# Patient Record
Sex: Female | Born: 1985 | Race: White | Hispanic: No | Smoking: Current every day smoker
Health system: Southern US, Community
[De-identification: ages and names within clinical notes are randomized; demographics above are authoritative.]

## PROBLEM LIST (undated history)

## (undated) DIAGNOSIS — J45909 Unspecified asthma, uncomplicated: Secondary | ICD-10-CM

## (undated) DIAGNOSIS — K219 Gastro-esophageal reflux disease without esophagitis: Secondary | ICD-10-CM

## (undated) DIAGNOSIS — F419 Anxiety disorder, unspecified: Secondary | ICD-10-CM

## (undated) DIAGNOSIS — T7840XA Allergy, unspecified, initial encounter: Secondary | ICD-10-CM

## (undated) DIAGNOSIS — F32A Depression, unspecified: Secondary | ICD-10-CM

## (undated) DIAGNOSIS — Z5189 Encounter for other specified aftercare: Secondary | ICD-10-CM

## (undated) HISTORY — PX: APPENDECTOMY: SHX54

## (undated) HISTORY — PX: CHOLECYSTECTOMY: SHX55

## (undated) HISTORY — DX: Unspecified asthma, uncomplicated: J45.909

## (undated) HISTORY — DX: Anxiety disorder, unspecified: F41.9

## (undated) HISTORY — PX: TONSILLECTOMY: SUR1361

## (undated) HISTORY — DX: Allergy, unspecified, initial encounter: T78.40XA

## (undated) HISTORY — PX: TUBAL LIGATION: SHX77

## (undated) HISTORY — DX: Gastro-esophageal reflux disease without esophagitis: K21.9

## (undated) HISTORY — DX: Encounter for other specified aftercare: Z51.89

## (undated) HISTORY — DX: Depression, unspecified: F32.A

---
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
## 2017-01-26 ENCOUNTER — Other Ambulatory Visit (HOSPITAL_COMMUNITY): Payer: Self-pay | Admitting: Family Medicine

## 2017-01-26 DIAGNOSIS — M79662 Pain in left lower leg: Secondary | ICD-10-CM

## 2017-01-31 ENCOUNTER — Ambulatory Visit (HOSPITAL_COMMUNITY)
Admission: RE | Admit: 2017-01-31 | Discharge: 2017-01-31 | Disposition: A | Discharge status: Home / Self Care | Payer: Medicaid Other | Source: Ambulatory Visit | Attending: Family Medicine | Admitting: Family Medicine

## 2017-01-31 DIAGNOSIS — M79662 Pain in left lower leg: Secondary | ICD-10-CM | POA: Insufficient documentation

## 2017-11-01 IMAGING — US US EXTREM LOW VENOUS*L*
1 series · 13 of 24 positions shown · non-contrast
Comparison: None.

CLINICAL DATA: Initial evaluation for acute left lower extremity
pain.



[Series 1: us extrem low venous*left* · 0.07mm/px · 13 of 38 slices shown]
[im 1/38]
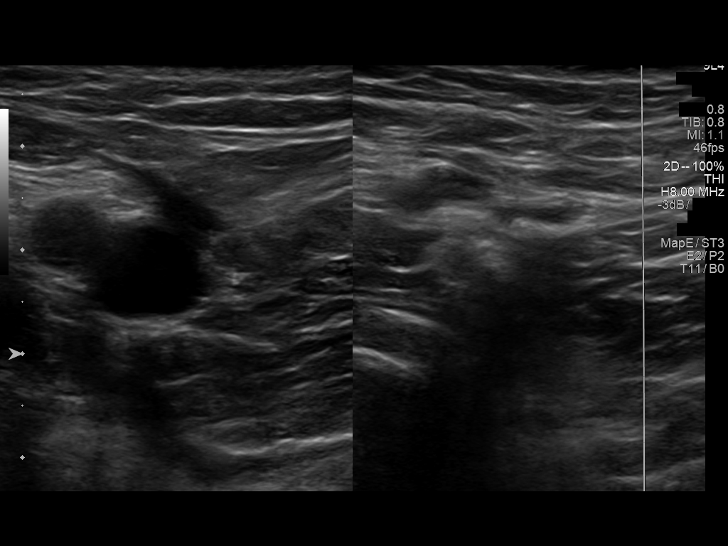
[im 4/38]
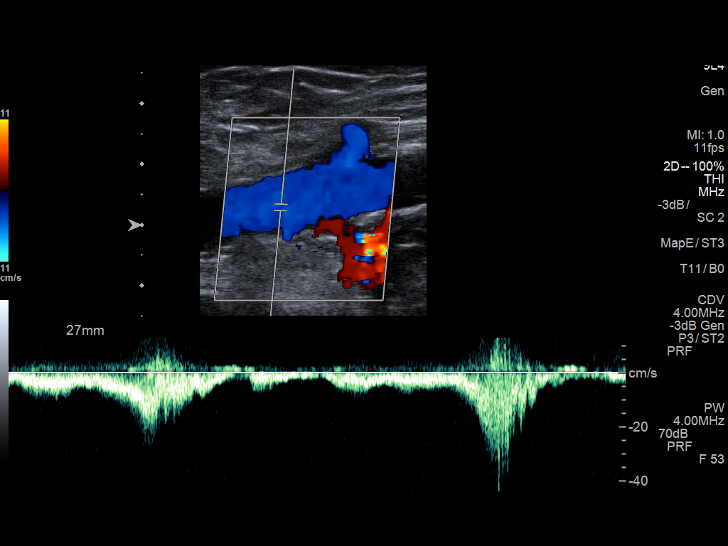
[im 7/38]
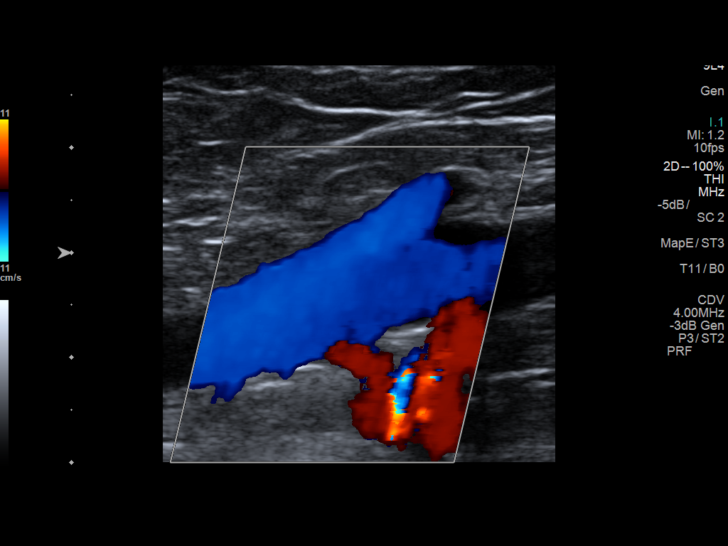
[im 10/38]
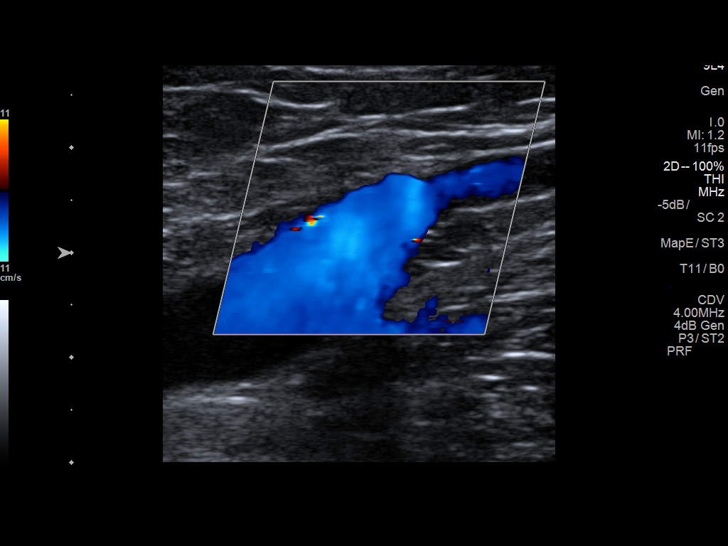
[im 13/38]
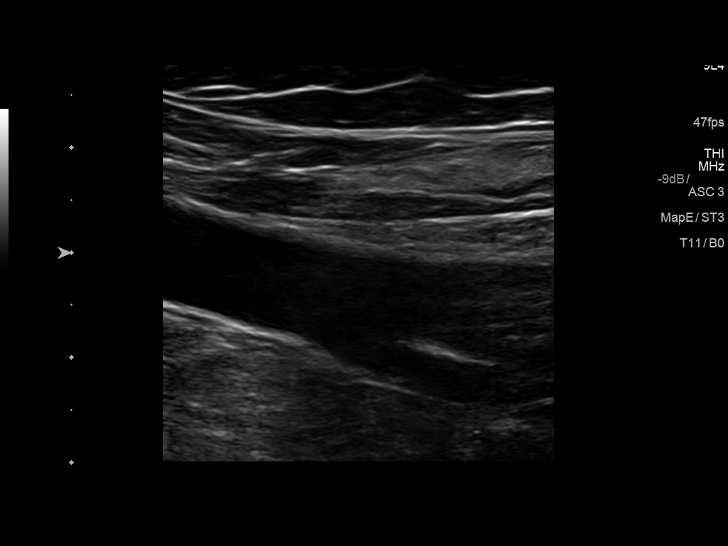
[im 17/38]
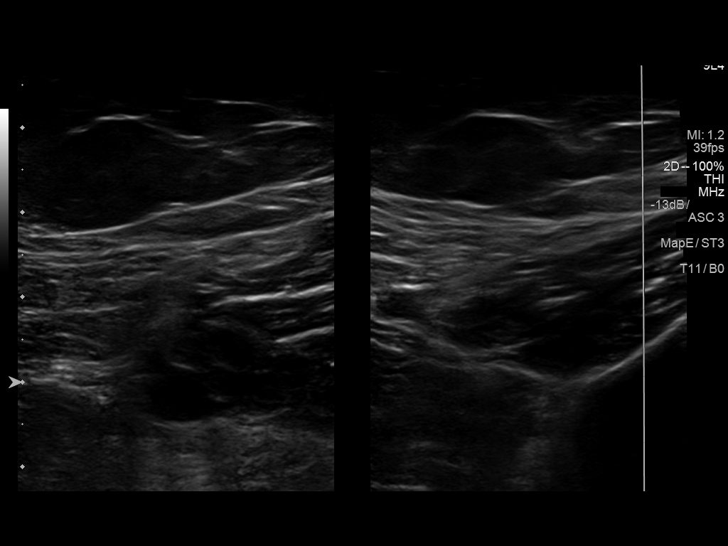
[im 20/38]
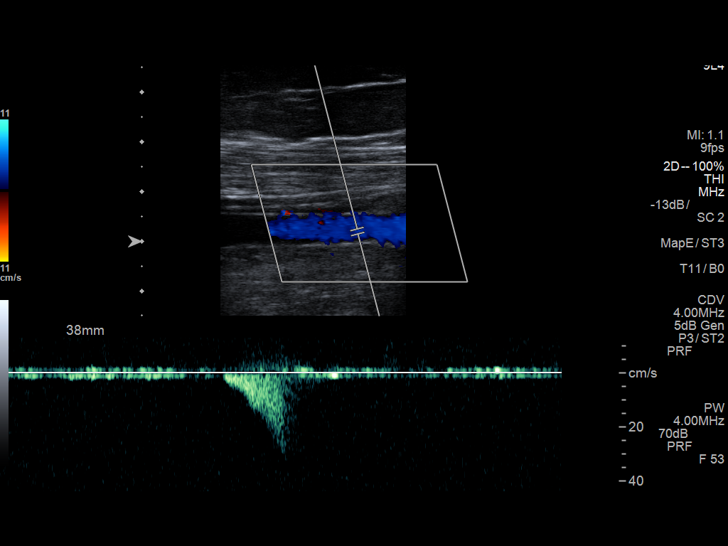
[im 21/38]
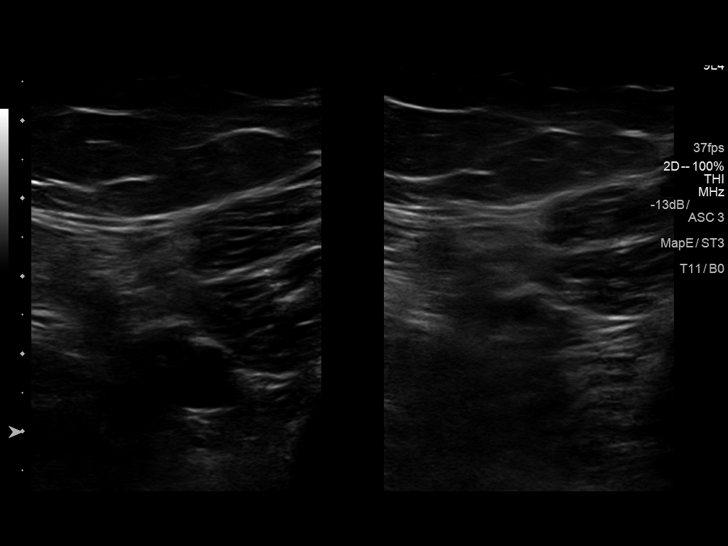
[im 25/38]
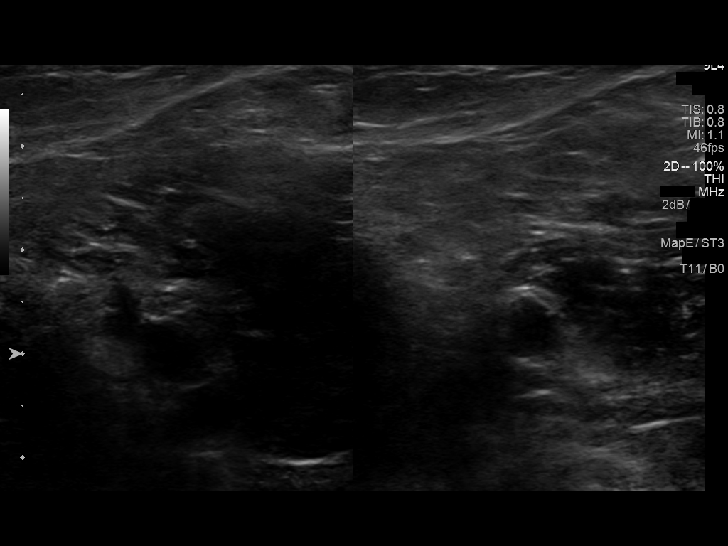
[im 28/38]
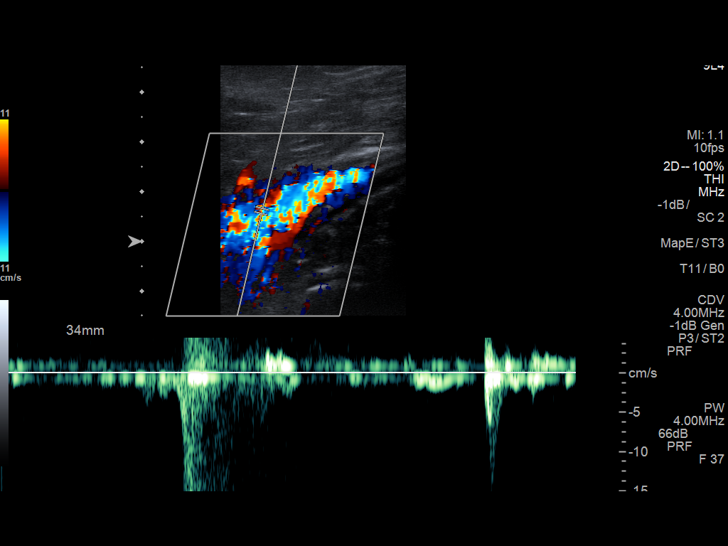
[im 31/38]
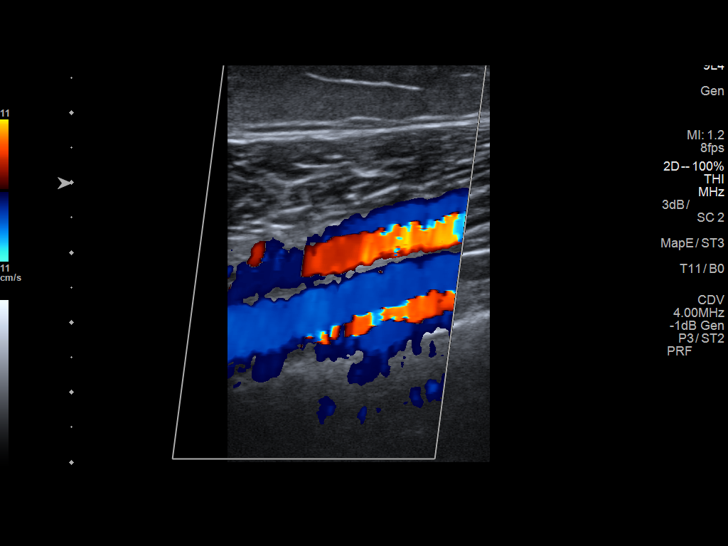
[im 34/38]
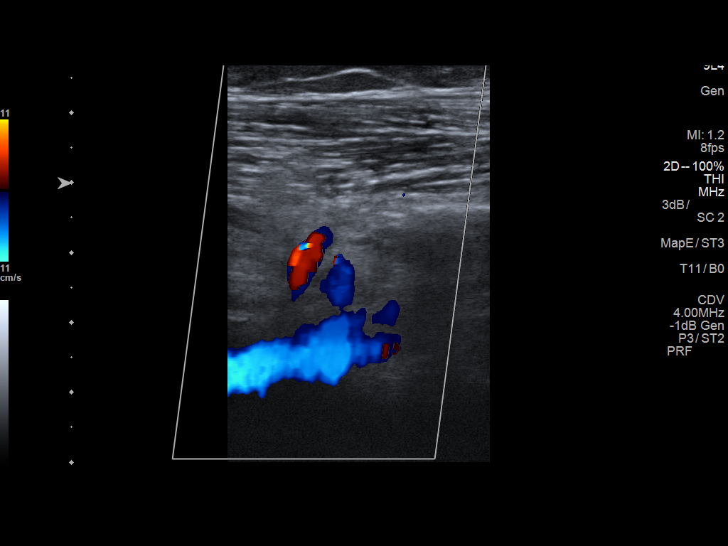
[im 38/38]
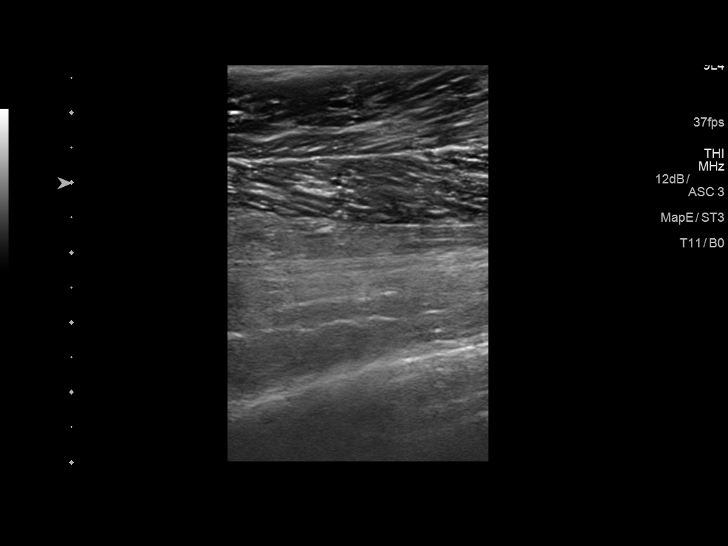

[13 of 24 positions shown; findings below may reference images not displayed]

FINDINGS: Contralateral Common Femoral Vein: Respiratory phasicity is normal
and symmetric with the symptomatic side. No evidence of thrombus.
Normal compressibility.

Common Femoral Vein: No evidence of thrombus. Normal
compressibility, respiratory phasicity and response to augmentation.

Saphenofemoral Junction: No evidence of thrombus. Normal
compressibility and flow on color Doppler imaging.

Profunda Femoral Vein: No evidence of thrombus. Normal
compressibility and flow on color Doppler imaging.

Femoral Vein: No evidence of thrombus. Normal compressibility,
respiratory phasicity and response to augmentation.

Popliteal Vein: No evidence of thrombus. Normal compressibility,
respiratory phasicity and response to augmentation.

Calf Veins: No evidence of thrombus. Normal compressibility and flow
on color Doppler imaging.

Superficial Great Saphenous Vein: No evidence of thrombus. Normal
compressibility and flow on color Doppler imaging.

Venous Reflux:  None.

Other Findings:  None.
IMPRESSION: No evidence of deep venous thrombosis.

## 2019-07-08 LAB — RESULTS CONSOLE HPV: CHL HPV: NEGATIVE

## 2019-07-08 LAB — HM PAP SMEAR: HM Pap smear: NEGATIVE

## 2019-07-23 DIAGNOSIS — F17200 Nicotine dependence, unspecified, uncomplicated: Secondary | ICD-10-CM | POA: Insufficient documentation

## 2019-07-23 DIAGNOSIS — R5383 Other fatigue: Secondary | ICD-10-CM | POA: Insufficient documentation

## 2019-07-23 DIAGNOSIS — F329 Major depressive disorder, single episode, unspecified: Secondary | ICD-10-CM | POA: Insufficient documentation

## 2019-07-23 DIAGNOSIS — D72828 Other elevated white blood cell count: Secondary | ICD-10-CM | POA: Insufficient documentation

## 2019-08-14 DIAGNOSIS — E669 Obesity, unspecified: Secondary | ICD-10-CM | POA: Insufficient documentation

## 2019-12-30 ENCOUNTER — Ambulatory Visit: Payer: Medicaid Other

## 2021-03-03 ENCOUNTER — Encounter: Payer: Self-pay | Admitting: Physician Assistant

## 2021-03-03 ENCOUNTER — Ambulatory Visit (INDEPENDENT_AMBULATORY_CARE_PROVIDER_SITE_OTHER): Payer: Medicaid Other | Admitting: Physician Assistant

## 2021-03-03 ENCOUNTER — Other Ambulatory Visit: Payer: Self-pay

## 2021-03-03 VITALS — BP 122/78 | HR 81 | Temp 97.8°F | Ht 62.0 in | Wt 183.2 lb

## 2021-03-03 DIAGNOSIS — B009 Herpesviral infection, unspecified: Secondary | ICD-10-CM | POA: Diagnosis not present

## 2021-03-03 DIAGNOSIS — L700 Acne vulgaris: Secondary | ICD-10-CM | POA: Diagnosis not present

## 2021-03-03 DIAGNOSIS — F17210 Nicotine dependence, cigarettes, uncomplicated: Secondary | ICD-10-CM | POA: Diagnosis not present

## 2021-03-03 MED ORDER — VALACYCLOVIR HCL 500 MG PO TABS: 500.0000 mg | ORAL_TABLET | Freq: Two times a day (BID) | ORAL | 0 refills | Status: AC

## 2021-03-03 MED ORDER — SPIRONOLACTONE 25 MG PO TABS: 25.0000 mg | ORAL_TABLET | Freq: Two times a day (BID) | ORAL | 2 refills | Status: DC

## 2021-03-03 NOTE — Progress Notes (Signed)
New Patient Office Visit  Subjective:  Patient ID: Nicole Pitts, female    DOB: 22-Dec-1985  Age: 35 y.o. MRN: [id number]  HPI MAGUIRE KILLMER presents for new patient establishment. She previously saw me at [facility] & I am familiar with her history. 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 She lives with her fiance. She is not taking any medications at this time.  Two concerns today: Cystic acne - She was seeing [facility] dermatology for Accutane, but only completed three months of treatment because it was too far of a drive and she was not seeing any differences.  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  Past Medical History:  Diagnosis Date  . Allergy   . Anxiety   . Asthma   . Blood transfusion without reported diagnosis   . Depression   . GERD (gastroesophageal reflux disease)     Past Surgical History:  Procedure Laterality Date  . APPENDECTOMY    . CHOLECYSTECTOMY    . TONSILLECTOMY    . TUBAL LIGATION      History reviewed. No pertinent family history.  Social History   Socioeconomic History  . Marital status: Single    Spouse name: Not on file  . Number of children: Not on file  . Years of education: Not on file  . Highest education level: Not on file  Occupational History  . Not on file  Tobacco Use  . Smoking status: Current Every Day Smoker    Types: Cigarettes  . Smokeless tobacco: Never Used  Vaping Use  . Vaping Use: Some days  Substance and Sexual Activity  . Alcohol use: Not Currently  . Drug use: Not on file  . Sexual activity: Not on file  Other Topics Concern  . Not on file  Social History Narrative  . Not on file   Social Determinants of Health   Financial Resource Strain: Not on file  Food Insecurity: Not on file  Transportation Needs: Not on file  Physical Activity: Not on file  Stress: Not on file  Social Connections: Not on file  Intimate Partner  Violence: Not on file    ROS Review of Systems REFER TO HPI FOR PERTINENT POSITIVES AND NEGATIVES  Objective:   Today's Vitals: BP 122/78   Pulse 81   Temp 97.8 F (36.6 C)   Ht 5\' 2"  (1.575 m)   Wt 183 lb 3.2 oz (83.1 kg)   SpO2 96%   BMI 33.51 kg/m   Physical Exam Vitals and nursing note reviewed.  Constitutional:      General: She is not in acute distress.    Appearance: She is obese.  Eyes:     Extraocular Movements: Extraocular movements intact.     Conjunctiva/sclera: Conjunctivae normal.     Pupils: Pupils are equal, round, and reactive to light.  Cardiovascular:     Pulses: Normal pulses.     Heart sounds: Normal heart sounds. No murmur heard.   Pulmonary:     Effort: Pulmonary effort is normal.     Breath sounds: Normal breath sounds.  Skin:    Comments: Cystic acne lesions and scarring present along chin, cheeks  Neurological:     Mental Status: She is alert.  Psychiatric:        Mood and Affect: Mood normal.     Assessment & Plan:   Problem List Items Addressed This Visit  None   Visit Diagnoses    Cystic acne vulgaris    -  Primary   HSV infection       Relevant Medications   valACYclovir (VALTREX) 500 MG tablet   Cigarette nicotine dependence without complication          Outpatient Encounter Medications as of 03/03/2021  Medication Sig  . spironolactone (ALDACTONE) 25 MG tablet Take 1 tablet (25 mg total) by mouth 2 (two) times daily.  . valACYclovir (VALTREX) 500 MG tablet Take 1 tablet (500 mg total) by mouth 2 (two) times daily for 5 days.   No facility-administered encounter medications on file as of 03/03/2021.    Follow-up: Return in about 4 months (around 07/03/2021) for Well Woman exam and fasting labs .   1. Cystic acne vulgaris She does not want to go back to dermatology at this time due to drive. I will try Spironolactone with her. Start at 25 mg once daily and then increase to BID after two weeks if tolerating well. Side  effects discussed. Call if any concerns.  2. HSV infection Known hx of HSV-2. Will treat with Valtrex as directed. She will recheck if worse or no improvement.  3. Cigarette nicotine dependence without complication Encouraged pt to consider quitting smoking. She is not ready at this time.   This visit occurred during the SARS-CoV-2 public health emergency.  Safety protocols were in place, including screening questions prior to the visit, additional usage of staff PPE, and extensive cleaning of exam room while observing appropriate contact time as indicated for disinfecting solutions.    Catalina Salasar M Crista Nuon, PA-C

## 2021-03-03 NOTE — Patient Instructions (Signed)
It was good to see you today!  Prescriptions have been sent to your pharmacy. Please take as directed and call if any problems. Continue to try to work on health changes including considering quitting smoking.  See you back for a well woman exam this Summer.

## 2021-07-05 ENCOUNTER — Other Ambulatory Visit (HOSPITAL_COMMUNITY)
Admission: RE | Admit: 2021-07-05 | Discharge: 2021-07-05 | Disposition: A | Discharge status: Home / Self Care | Payer: Medicaid Other | Source: Ambulatory Visit | Attending: Physician Assistant | Admitting: Physician Assistant

## 2021-07-05 ENCOUNTER — Encounter: Payer: Medicaid Other | Admitting: Physician Assistant

## 2021-07-05 ENCOUNTER — Other Ambulatory Visit: Payer: Self-pay

## 2021-07-05 ENCOUNTER — Encounter: Payer: Self-pay | Admitting: Physician Assistant

## 2021-07-05 ENCOUNTER — Ambulatory Visit (INDEPENDENT_AMBULATORY_CARE_PROVIDER_SITE_OTHER): Payer: Medicaid Other | Admitting: Physician Assistant

## 2021-07-05 VITALS — BP 110/74 | HR 64 | Temp 98.3°F | Ht 62.0 in | Wt 177.8 lb

## 2021-07-05 DIAGNOSIS — Z124 Encounter for screening for malignant neoplasm of cervix: Secondary | ICD-10-CM

## 2021-07-05 DIAGNOSIS — Z0000 Encounter for general adult medical examination without abnormal findings: Secondary | ICD-10-CM

## 2021-07-05 DIAGNOSIS — Z131 Encounter for screening for diabetes mellitus: Secondary | ICD-10-CM

## 2021-07-05 DIAGNOSIS — Z114 Encounter for screening for human immunodeficiency virus [HIV]: Secondary | ICD-10-CM

## 2021-07-05 DIAGNOSIS — L700 Acne vulgaris: Secondary | ICD-10-CM | POA: Diagnosis not present

## 2021-07-05 DIAGNOSIS — Z1159 Encounter for screening for other viral diseases: Secondary | ICD-10-CM | POA: Diagnosis not present

## 2021-07-05 DIAGNOSIS — Z13220 Encounter for screening for lipoid disorders: Secondary | ICD-10-CM | POA: Diagnosis not present

## 2021-07-05 LAB — TSH: TSH: 2.03 u[IU]/mL (ref 0.35–5.50)

## 2021-07-05 LAB — LIPID PANEL
Cholesterol: 213 mg/dL — ABNORMAL HIGH (ref 0–200)
HDL: 40.8 mg/dL (ref >39.00)
LDL Cholesterol: 133 mg/dL — ABNORMAL HIGH (ref 0–99)
NonHDL: 172.53
Total CHOL/HDL Ratio: 5
Triglycerides: 199 mg/dL — ABNORMAL HIGH (ref 0.0–149.0)
VLDL: 39.8 mg/dL (ref 0.0–40.0)

## 2021-07-05 LAB — CBC WITH DIFFERENTIAL/PLATELET
Basophils Absolute: 0.1 10*3/uL (ref 0.0–0.1)
Basophils Relative: 0.9 % (ref 0.0–3.0)
Eosinophils Absolute: 0.3 10*3/uL (ref 0.0–0.7)
Eosinophils Relative: 2.6 % (ref 0.0–5.0)
HCT: 43.2 % (ref 36.0–46.0)
Hemoglobin: 14.3 g/dL (ref 12.0–15.0)
Lymphocytes Relative: 24.6 % (ref 12.0–46.0)
Lymphs Abs: 2.9 10*3/uL (ref 0.7–4.0)
MCHC: 33.2 g/dL (ref 30.0–36.0)
MCV: 85.8 fl (ref 78.0–100.0)
Monocytes Absolute: 0.6 10*3/uL (ref 0.1–1.0)
Monocytes Relative: 5.2 % (ref 3.0–12.0)
Neutro Abs: 7.9 10*3/uL — ABNORMAL HIGH (ref 1.4–7.7)
Neutrophils Relative %: 66.7 % (ref 43.0–77.0)
Platelets: 361 10*3/uL (ref 150.0–400.0)
RBC: 5.03 Mil/uL (ref 3.87–5.11)
RDW: 13.2 % (ref 11.5–15.5)
WBC: 11.8 10*3/uL — ABNORMAL HIGH (ref 4.0–10.5)

## 2021-07-05 LAB — COMPREHENSIVE METABOLIC PANEL
ALT: 22 U/L (ref 0–35)
AST: 17 U/L (ref 0–37)
Albumin: 4.5 g/dL (ref 3.5–5.2)
Alkaline Phosphatase: 81 U/L (ref 39–117)
BUN: 5 mg/dL — ABNORMAL LOW (ref 6–23)
CO2: 27 mEq/L (ref 19–32)
Calcium: 10.2 mg/dL (ref 8.4–10.5)
Chloride: 101 mEq/L (ref 96–112)
Creatinine, Ser: 0.78 mg/dL (ref 0.40–1.20)
GFR: 99.43 mL/min (ref >60.00)
Glucose, Bld: 88 mg/dL (ref 70–99)
Potassium: 4.2 mEq/L (ref 3.5–5.1)
Sodium: 136 mEq/L (ref 135–145)
Total Bilirubin: 0.3 mg/dL (ref 0.2–1.2)
Total Protein: 7.8 g/dL (ref 6.0–8.3)

## 2021-07-05 MED ORDER — SPIRONOLACTONE 25 MG PO TABS: 25.0000 mg | ORAL_TABLET | Freq: Two times a day (BID) | ORAL | 2 refills | Status: DC

## 2021-07-05 NOTE — Progress Notes (Signed)
Established Patient Office Visit  Subjective:  Patient ID: Nicole Pitts, female    DOB: 07-17-1986  Age: 35 y.o. MRN: [id number]  CC:  Chief Complaint  Patient presents with   Annual Exam    Pt has no concerns today.     HPI Nicole Pitts presents for annual CPE.  Acute concerns: Cystic acne - much improved with spironolactone  Health maintenance: Lifestyle/ exercise: Up & down with motivation Nutrition: Only eats once per day, usually dinner time; mix between eating out or cooking at home  Mental health: Depression worse during the winter; doing good right now though and not on any treatment  Caffeine: Monster drinks, usually once per day, and coffee  Sleep: Sleep is so-so right now with kids out for the summer  Substance use: Rare alcohol; still smoking & occasional vaping  Sexual activity: Monogamous  Immunizations: UTD  Pap: Due for pap today Menstrual cycles: Regular, no issues  Past Medical History:  Diagnosis Date   Allergy    Anxiety    Asthma    Blood transfusion without reported diagnosis    Depression    GERD (gastroesophageal reflux disease)     Past Surgical History:  Procedure Laterality Date   APPENDECTOMY     CHOLECYSTECTOMY     TONSILLECTOMY     TUBAL LIGATION      History reviewed. No pertinent family history.  Social History   Socioeconomic History   Marital status: Single    Spouse name: Not on file   Number of children: Not on file   Years of education: Not on file   Highest education level: Not on file  Occupational History   Not on file  Tobacco Use   Smoking status: Every Day    Types: Cigarettes   Smokeless tobacco: Never  Vaping Use   Vaping Use: Some days  Substance and Sexual Activity   Alcohol use: Not Currently   Drug use: Not on file   Sexual activity: Not on file  Other Topics Concern   Not on file  Social History Narrative   Not on file   Social Determinants of Health   Financial Resource  Strain: Not on file  Food Insecurity: Not on file  Transportation Needs: Not on file  Physical Activity: Not on file  Stress: Not on file  Social Connections: Not on file  Intimate Partner Violence: Not on file    Outpatient Medications Prior to Visit  Medication Sig Dispense Refill   spironolactone (ALDACTONE) 25 MG tablet Take 1 tablet (25 mg total) by mouth 2 (two) times daily. 60 tablet 2   No facility-administered medications prior to visit.    Allergies  Allergen Reactions   Lithium Other (See Comments)    Worsened bipolar Worsened bipolar    Quetiapine Other (See Comments)    Worsened bipolar Worsened bipolar    Clindamycin Rash   Penicillins Rash    ROS Review of Systems  Constitutional:  Negative for activity change, appetite change, fever and unexpected weight change.  HENT:  Negative for congestion.   Eyes:  Negative for visual disturbance.  Respiratory:  Negative for apnea, cough and shortness of breath.   Cardiovascular:  Negative for chest pain, palpitations and leg swelling.  Gastrointestinal:  Negative for abdominal pain, blood in stool, constipation and diarrhea.  Endocrine: Negative for polydipsia, polyphagia and polyuria.  Genitourinary:  Negative for dysuria and pelvic pain.  Musculoskeletal:  Negative for arthralgias.  Skin:  Negative  for rash.  Neurological:  Negative for dizziness, weakness and headaches.  Hematological:  Negative for adenopathy. Does not bruise/bleed easily.  Psychiatric/Behavioral:  Positive for dysphoric mood (comes and goes). Negative for sleep disturbance and suicidal ideas. The patient is not nervous/anxious.      Objective:    Physical Exam Vitals and nursing note reviewed. Exam conducted with a chaperone present.  Constitutional:      General: She is not in acute distress.    Appearance: Normal appearance. She is normal weight.  HENT:     Head: Normocephalic.     Right Ear: External ear normal.     Left Ear:  External ear normal.     Nose: Nose normal.     Mouth/Throat:     Mouth: Mucous membranes are moist.  Eyes:     Extraocular Movements: Extraocular movements intact.     Conjunctiva/sclera: Conjunctivae normal.     Pupils: Pupils are equal, round, and reactive to light.  Cardiovascular:     Rate and Rhythm: Normal rate and regular rhythm.     Pulses: Normal pulses.     Heart sounds: No murmur heard. Pulmonary:     Effort: Pulmonary effort is normal.     Breath sounds: Normal breath sounds.  Chest:  Breasts:    Right: Normal.     Left: Normal.  Abdominal:     General: Abdomen is flat. Bowel sounds are normal.     Palpations: Abdomen is soft.     Tenderness: There is no abdominal tenderness.  Genitourinary:    Labia:        Right: No rash, tenderness or lesion.        Left: No rash, tenderness or lesion.      Vagina: Normal.     Cervix: Normal and dilated.     Uterus: Normal.      Adnexa: Right adnexa normal and left adnexa normal.  Musculoskeletal:        General: Normal range of motion.     Cervical back: Normal range of motion.  Skin:    General: Skin is warm.  Neurological:     General: No focal deficit present.     Mental Status: She is alert and oriented to person, place, and time.     Gait: Gait normal.  Psychiatric:        Mood and Affect: Mood normal.        Behavior: Behavior normal.    BP 110/74   Pulse 64   Temp 98.3 F (36.8 C) (Temporal)   Ht 5' 2" (1.575 m)   Wt 177 lb 12.8 oz (80.6 kg)   LMP 06/20/2021 (Approximate)   SpO2 98%   BMI 32.52 kg/m  Wt Readings from Last 3 Encounters:  07/05/21 177 lb 12.8 oz (80.6 kg)  03/03/21 183 lb 3.2 oz (83.1 kg)     Health Maintenance Due  Topic Date Due   Pneumococcal Vaccine 12-54 Years old (1 - PCV) Never done   PAP SMEAR-Modifier  Never done   INFLUENZA VACCINE  06/27/2021    There are no preventive care reminders to display for this patient.  No results found for: TSH No results found for: WBC,  HGB, HCT, MCV, PLT No results found for: NA, K, CHLORIDE, CO2, GLUCOSE, BUN, CREATININE, BILITOT, ALKPHOS, AST, ALT, PROT, ALBUMIN, CALCIUM, ANIONGAP, EGFR, GFR No results found for: CHOL No results found for: HDL No results found for: LDLCALC No results found for: TRIG No results  found for: CHOLHDL No results found for: HGBA1C    Assessment & Plan:   Problem List Items Addressed This Visit   None Visit Diagnoses     Encounter for annual physical exam    -  Primary   Relevant Orders   TSH   Comprehensive metabolic panel   CBC with Differential/Platelet   Lipid panel   Cystic acne vulgaris       Diabetes mellitus screening       Relevant Orders   Comprehensive metabolic panel   Need for hepatitis C screening test       Relevant Orders   Hepatitis C antibody   Encounter for screening for HIV       Relevant Orders   HIV Antibody (routine testing w rflx)   Encounter for Papanicolaou smear for cervical cancer screening       Relevant Orders   Cytology - PAP   Screening for cholesterol level       Relevant Orders   Lipid panel       Meds ordered this encounter  Medications   spironolactone (ALDACTONE) 25 MG tablet    Sig: Take 1 tablet (25 mg total) by mouth 2 (two) times daily.    Dispense:  60 tablet    Refill:  2    Follow-up: Return in about 1 year (around 07/05/2022) for Well woman .   Plan: Age-appropriate screening and counseling performed today. Will check labs and call with results. Preventive measures discussed and printed in AVS for patient. Encouraged continue to work on healthy diet and exercise changes. Needs to limit caffeine.    Alyssa M Allwardt, PA-C

## 2021-07-05 NOTE — Patient Instructions (Signed)
Good to see you today! Please go to the lab for blood work and I will send results through MyChart.  I'm glad the spironolactone is working!  Call if any concerns.

## 2021-07-06 LAB — CYTOLOGY - PAP
Adequacy: ABSENT
Chlamydia: NEGATIVE
Comment: NEGATIVE
Comment: NEGATIVE
Comment: NORMAL
Diagnosis: NEGATIVE
Neisseria Gonorrhea: NEGATIVE
Trichomonas: NEGATIVE

## 2021-07-06 LAB — HEPATITIS C ANTIBODY
Hepatitis C Ab: NONREACTIVE
SIGNAL TO CUT-OFF: 0.13 (ref <1.00)

## 2021-07-06 LAB — HIV ANTIBODY (ROUTINE TESTING W REFLEX): HIV 1&2 Ab, 4th Generation: NONREACTIVE

## 2021-07-15 ENCOUNTER — Telehealth: Payer: Self-pay

## 2021-07-15 ENCOUNTER — Telehealth (INDEPENDENT_AMBULATORY_CARE_PROVIDER_SITE_OTHER): Payer: Medicaid Other | Admitting: Physician Assistant

## 2021-07-15 ENCOUNTER — Other Ambulatory Visit: Payer: Self-pay

## 2021-07-15 ENCOUNTER — Encounter: Payer: Self-pay | Admitting: Physician Assistant

## 2021-07-15 VITALS — Temp 98.3°F | Ht 62.0 in | Wt 177.7 lb

## 2021-07-15 DIAGNOSIS — J069 Acute upper respiratory infection, unspecified: Secondary | ICD-10-CM | POA: Diagnosis not present

## 2021-07-15 NOTE — Progress Notes (Signed)
Virtual Visit via Video Note  I connected with  Nicole Pitts  on 07/15/21 at  4:00 PM EDT by a video enabled telemedicine application and verified that I am speaking with the correct person using two identifiers.  Location: Patient: home Provider: Nature conservation officer at [employer] Persons present: Patient and myself   I discussed the limitations of evaluation and management by telemedicine and the availability of in person appointments. The patient expressed understanding and agreed to proceed.   History of Present Illness: Chief complaint: Dry cough and congestion Symptom onset: 07/11/21 Pertinent positives: ST, Cough, congestion, loss of taste Pertinent negatives: Fever, chills, body aches, SOB, CP Treatments tried: Nyquil, Dayquil, Robitussin Vaccine status: Did not have COVID vaccines  Sick exposure: Fiance has had similar symptoms    Observations/Objective:  Gen: Awake, alert, no acute distress Resp: Breathing is even and non-labored Psych: calm/pleasant demeanor Neuro: Alert and Oriented x 3, + facial symmetry, speech is clear.   Assessment and Plan: 1. Viral upper respiratory illness Patient did call back after our initial phone call visit and she let me know that her rapid COVID-19 test was negative.  This was reassuring, as well as the fact that her fianc has had several negative tests as well.  I do think that this is probably another viral upper respiratory infection that should resolve with time on its own.  I did recommend supportive care for her including over-the-counter cough and cold medication such as Robitussin or Mucinex.  She should also push fluids and rest.  She may take Tylenol or ibuprofen as needed.  She will call back next week if she is worse or has any change in symptoms.  Follow Up Instructions:    I discussed the assessment and treatment plan with the patient. The patient was provided an opportunity to ask questions and all were  answered. The patient agreed with the plan and demonstrated an understanding of the instructions.   The patient was advised to call back or seek an in-person evaluation if the symptoms worsen or if the condition fails to improve as anticipated.  Video connection was lost at >50% of the duration of the visit, at which time the remainder of the visit was completed via audio only.  Total time spent with patient on the phone was 6 minutes 15 seconds.  Anfernee Peschke M Evany Schecter, PA-C

## 2021-07-15 NOTE — Telephone Encounter (Signed)
Notified patient of results,patient stated do you think this is bronchitis.She stated usually a antibiotic and steroid is prescribed do to her symptoms. Please advise

## 2021-07-15 NOTE — Telephone Encounter (Signed)
Patient is calling in stating her covid test is negative. Wanting to know next treatment options.

## 2021-08-12 DIAGNOSIS — M778 Other enthesopathies, not elsewhere classified: Secondary | ICD-10-CM | POA: Diagnosis not present

## 2022-02-15 DIAGNOSIS — H5213 Myopia, bilateral: Secondary | ICD-10-CM | POA: Diagnosis not present

## 2022-07-11 ENCOUNTER — Other Ambulatory Visit (HOSPITAL_COMMUNITY)
Admission: RE | Admit: 2022-07-11 | Discharge: 2022-07-11 | Disposition: A | Discharge status: Home / Self Care | Payer: Medicaid Other | Source: Ambulatory Visit | Attending: Physician Assistant | Admitting: Physician Assistant

## 2022-07-11 ENCOUNTER — Encounter: Payer: Self-pay | Admitting: Physician Assistant

## 2022-07-11 ENCOUNTER — Ambulatory Visit (INDEPENDENT_AMBULATORY_CARE_PROVIDER_SITE_OTHER): Payer: Medicaid Other | Admitting: Physician Assistant

## 2022-07-11 VITALS — BP 118/81 | HR 70 | Temp 98.0°F | Ht 62.0 in | Wt 182.0 lb

## 2022-07-11 DIAGNOSIS — F17210 Nicotine dependence, cigarettes, uncomplicated: Secondary | ICD-10-CM | POA: Diagnosis not present

## 2022-07-11 DIAGNOSIS — D72829 Elevated white blood cell count, unspecified: Secondary | ICD-10-CM

## 2022-07-11 DIAGNOSIS — E782 Mixed hyperlipidemia: Secondary | ICD-10-CM | POA: Diagnosis not present

## 2022-07-11 DIAGNOSIS — Z01419 Encounter for gynecological examination (general) (routine) without abnormal findings: Secondary | ICD-10-CM | POA: Insufficient documentation

## 2022-07-11 DIAGNOSIS — Z6833 Body mass index (BMI) 33.0-33.9, adult: Secondary | ICD-10-CM | POA: Diagnosis not present

## 2022-07-11 DIAGNOSIS — E6609 Other obesity due to excess calories: Secondary | ICD-10-CM

## 2022-07-11 LAB — CBC WITH DIFFERENTIAL/PLATELET
Basophils Absolute: 0.1 10*3/uL (ref 0.0–0.1)
Basophils Relative: 1 % (ref 0.0–3.0)
Eosinophils Absolute: 0.3 10*3/uL (ref 0.0–0.7)
Eosinophils Relative: 3.2 % (ref 0.0–5.0)
HCT: 41.4 % (ref 36.0–46.0)
Hemoglobin: 13.5 g/dL (ref 12.0–15.0)
Lymphocytes Relative: 26.7 % (ref 12.0–46.0)
Lymphs Abs: 2.7 10*3/uL (ref 0.7–4.0)
MCHC: 32.6 g/dL (ref 30.0–36.0)
MCV: 85.2 fl (ref 78.0–100.0)
Monocytes Absolute: 0.6 10*3/uL (ref 0.1–1.0)
Monocytes Relative: 5.5 % (ref 3.0–12.0)
Neutro Abs: 6.4 10*3/uL (ref 1.4–7.7)
Neutrophils Relative %: 63.6 % (ref 43.0–77.0)
Platelets: 316 10*3/uL (ref 150.0–400.0)
RBC: 4.86 Mil/uL (ref 3.87–5.11)
RDW: 13.8 % (ref 11.5–15.5)
WBC: 10 10*3/uL (ref 4.0–10.5)

## 2022-07-11 LAB — COMPREHENSIVE METABOLIC PANEL
ALT: 17 U/L (ref 0–35)
AST: 15 U/L (ref 0–37)
Albumin: 4.4 g/dL (ref 3.5–5.2)
Alkaline Phosphatase: 68 U/L (ref 39–117)
BUN: 8 mg/dL (ref 6–23)
CO2: 26 mEq/L (ref 19–32)
Calcium: 9.9 mg/dL (ref 8.4–10.5)
Chloride: 103 mEq/L (ref 96–112)
Creatinine, Ser: 0.74 mg/dL (ref 0.40–1.20)
GFR: 105.16 mL/min (ref >60.00)
Glucose, Bld: 87 mg/dL (ref 70–99)
Potassium: 4.7 mEq/L (ref 3.5–5.1)
Sodium: 138 mEq/L (ref 135–145)
Total Bilirubin: 0.3 mg/dL (ref 0.2–1.2)
Total Protein: 7 g/dL (ref 6.0–8.3)

## 2022-07-11 LAB — LIPID PANEL
Cholesterol: 216 mg/dL — ABNORMAL HIGH (ref 0–200)
HDL: 36.6 mg/dL — ABNORMAL LOW (ref >39.00)
NonHDL: 179.05
Total CHOL/HDL Ratio: 6
Triglycerides: 226 mg/dL — ABNORMAL HIGH (ref 0.0–149.0)
VLDL: 45.2 mg/dL — ABNORMAL HIGH (ref 0.0–40.0)

## 2022-07-11 LAB — TSH: TSH: 1.84 u[IU]/mL (ref 0.35–5.50)

## 2022-07-11 LAB — LDL CHOLESTEROL, DIRECT: Direct LDL: 148 mg/dL

## 2022-07-11 NOTE — Patient Instructions (Signed)
Keep working on lifestyle changes and quitting smoking! Labs and pap smear today

## 2022-07-11 NOTE — Progress Notes (Signed)
Subjective:    Patient ID: Nicole Pitts, female    DOB: 22-Nov-1986, 36 y.o.   MRN: [id number]  Chief Complaint  Patient presents with   Annual Exam    Pt coming into office for annual CPE and well women's exam, pt is fasting for labs if needed today with CPE; pt has no concerns;     HPI Patient is in today for annual well woman exam.  Acute concerns: No acute health concerns  Health maintenance: Lifestyle/ exercise: Working on walking more, trying to stay active with kids Nutrition: Working on balance Mental health: Doing well Caffeine: Coffee Sleep: Doing well Substance use: Cigarettes - 1 pack lasts 3 days now Sexual activity: Monogamous with fiance Immunizations: UTD  Pap: updating today  Mammogram: Start at age 36    Past Medical History:  Diagnosis Date   Allergy    Anxiety    Asthma    Blood transfusion without reported diagnosis    Depression    GERD (gastroesophageal reflux disease)     Past Surgical History:  Procedure Laterality Date   APPENDECTOMY     CHOLECYSTECTOMY     TONSILLECTOMY     TUBAL LIGATION      Family History  Problem Relation Age of Onset   Diabetes Mother    High Cholesterol Mother    Hypertension Mother    Diabetes Father    Hypertension Father    High Cholesterol Father    Heart Problems Father    Hypertension Brother    High Cholesterol Brother     Social History   Tobacco Use   Smoking status: Every Day    Types: Cigarettes   Smokeless tobacco: Never  Vaping Use   Vaping Use: Every day  Substance Use Topics   Alcohol use: Not Currently   Drug use: Not Currently     Allergies  Allergen Reactions   Lithium Other (See Comments)    Worsened bipolar Worsened bipolar    Quetiapine Other (See Comments)    Worsened bipolar Worsened bipolar    Clindamycin Rash   Penicillins Rash    Review of Systems NEGATIVE UNLESS OTHERWISE INDICATED IN HPI      Objective:     BP 118/81 (BP Location: Left  Arm)   Pulse 70   Temp 98 F (36.7 C) (Temporal)   Ht 5\' 2"  (1.575 m)   Wt 182 lb (82.6 kg)   LMP 07/03/2022 (Exact Date)   SpO2 98%   BMI 33.29 kg/m   Wt Readings from Last 3 Encounters:  07/11/22 182 lb (82.6 kg)  07/15/21 177 lb 11.1 oz (80.6 kg)  07/05/21 177 lb 12.8 oz (80.6 kg)    BP Readings from Last 3 Encounters:  07/11/22 118/81  07/05/21 110/74  03/03/21 122/78     Physical Exam Vitals and nursing note reviewed. Exam conducted with a chaperone present.  Constitutional:      Appearance: Normal appearance. She is normal weight. She is not toxic-appearing.  HENT:     Head: Normocephalic and atraumatic.     Right Ear: Tympanic membrane, ear canal and external ear normal.     Left Ear: Tympanic membrane, ear canal and external ear normal.     Nose: Nose normal.     Mouth/Throat:     Mouth: Mucous membranes are moist.  Eyes:     Extraocular Movements: Extraocular movements intact.     Conjunctiva/sclera: Conjunctivae normal.     Pupils: Pupils are equal,  round, and reactive to light.  Cardiovascular:     Rate and Rhythm: Normal rate and regular rhythm.     Pulses: Normal pulses.     Heart sounds: Normal heart sounds.  Pulmonary:     Effort: Pulmonary effort is normal.     Breath sounds: Normal breath sounds.  Chest:     Chest wall: No mass.  Breasts:    Right: Normal.     Left: Normal.  Abdominal:     General: Abdomen is flat. Bowel sounds are normal.     Palpations: Abdomen is soft.  Genitourinary:    Labia:        Right: No rash or tenderness.        Left: No rash or tenderness.      Vagina: Normal.     Cervix: Normal.     Uterus: Normal.      Adnexa: Right adnexa normal and left adnexa normal.  Musculoskeletal:        General: Normal range of motion.     Cervical back: Normal range of motion and neck supple.  Lymphadenopathy:     Upper Body:     Right upper body: No supraclavicular or axillary adenopathy.     Left upper body: No  supraclavicular or axillary adenopathy.  Skin:    General: Skin is warm and dry.     Findings: No lesion or rash.  Neurological:     General: No focal deficit present.     Mental Status: She is alert and oriented to person, place, and time.  Psychiatric:        Mood and Affect: Mood normal.        Behavior: Behavior normal.        Thought Content: Thought content normal.        Judgment: Judgment normal.        Assessment & Plan:   Problem List Items Addressed This Visit   None Visit Diagnoses     Well woman exam with routine gynecological exam    -  Primary   Relevant Orders   Cytology - PAP   CBC with Differential/Platelet   Comprehensive metabolic panel   Lipid panel   TSH   Leukocytosis, unspecified type       Relevant Orders   CBC with Differential/Platelet   Cigarette nicotine dependence without complication       Relevant Orders   CBC with Differential/Platelet   Mixed hyperlipidemia       Relevant Orders   Lipid panel   Class 1 obesity due to excess calories without serious comorbidity with body mass index (BMI) of 33.0 to 33.9 in adult       Relevant Orders   CBC with Differential/Platelet   Comprehensive metabolic panel   Lipid panel   TSH       Plan: Age-appropriate screening and counseling performed today. Will check labs and call with results. Preventive measures discussed and printed in AVS for patient.  -Pap smear performed -Encouraged to keep working on lifestyle and quitting smoking  Patient Counseling: [x]   Nutrition: Stressed importance of moderation in sodium/caffeine intake, saturated fat and cholesterol, caloric balance, sufficient intake of fresh fruits, vegetables, and fiber.  [x]   Stressed the importance of regular exercise.   [x]   Substance Abuse: Discussed cessation/primary prevention of tobacco, alcohol, or other drug use; driving or other dangerous activities under the influence; availability of treatment for abuse.   []   Injury  prevention: Discussed safety belts,  safety helmets, smoke detector, smoking near bedding or upholstery.   [x]   Sexuality: Discussed sexually transmitted diseases, partner selection, use of condoms, avoidance of unintended pregnancy  and contraceptive alternatives.   [x]   Dental health: Discussed importance of regular tooth brushing, flossing, and dental visits.  [x]   Health maintenance and immunizations reviewed. Please refer to Health maintenance section.      Return in about 1 year (around 07/12/2023) for CPE, labs .     Maleni Seyer M Codylee Patil, PA-C

## 2022-07-16 LAB — CYTOLOGY - PAP
Comment: NEGATIVE
Diagnosis: UNDETERMINED — AB
High risk HPV: NEGATIVE

## 2022-07-20 ENCOUNTER — Telehealth: Payer: Self-pay | Admitting: Physician Assistant

## 2022-07-20 NOTE — Telephone Encounter (Signed)
Patient requests to be called at ph# [phone] to be given pap results

## 2022-07-28 ENCOUNTER — Encounter: Payer: Self-pay | Admitting: Physician Assistant

## 2022-08-21 ENCOUNTER — Encounter: Payer: Self-pay | Admitting: *Deleted

## 2022-11-09 ENCOUNTER — Encounter: Payer: Self-pay | Admitting: *Deleted

## 2023-04-04 DIAGNOSIS — J069 Acute upper respiratory infection, unspecified: Secondary | ICD-10-CM | POA: Diagnosis not present

## 2023-06-27 ENCOUNTER — Encounter (INDEPENDENT_AMBULATORY_CARE_PROVIDER_SITE_OTHER): Payer: Self-pay

## 2023-07-13 ENCOUNTER — Encounter: Payer: Medicaid Other | Admitting: Physician Assistant

## 2023-08-01 DIAGNOSIS — M79671 Pain in right foot: Secondary | ICD-10-CM | POA: Diagnosis not present

## 2023-08-01 DIAGNOSIS — Z6841 Body Mass Index (BMI) 40.0 and over, adult: Secondary | ICD-10-CM | POA: Diagnosis not present

## 2023-08-01 DIAGNOSIS — R030 Elevated blood-pressure reading, without diagnosis of hypertension: Secondary | ICD-10-CM | POA: Diagnosis not present

## 2023-08-17 ENCOUNTER — Telehealth: Payer: Self-pay

## 2023-08-17 NOTE — Telephone Encounter (Signed)
Medicaid Managed Care   Unsuccessful Outreach Note  08/17/2023 Name: Nicole Pitts MRN: [id number] DOB: 29-Dec-1985  Referred by: Allwardt, Crist Infante, PA-C Reason for referral : No chief complaint on file.   An unsuccessful telephone outreach was attempted today. The patient was referred to the case management team for assistance with care management and care coordination.   Follow Up Plan: If patient returns call to provider office, please advise to call Embedded Care Management Care Guide Nicholes Rough* at [phone]*  Nicholes Rough, CMA Care Guide [employer]

## 2023-09-17 DIAGNOSIS — S61302A Unspecified open wound of right middle finger with damage to nail, initial encounter: Secondary | ICD-10-CM | POA: Diagnosis not present

## 2023-09-17 DIAGNOSIS — Z23 Encounter for immunization: Secondary | ICD-10-CM | POA: Diagnosis not present

## 2023-09-17 DIAGNOSIS — S61208A Unspecified open wound of other finger without damage to nail, initial encounter: Secondary | ICD-10-CM | POA: Diagnosis not present

## 2023-10-03 DIAGNOSIS — B349 Viral infection, unspecified: Secondary | ICD-10-CM | POA: Diagnosis not present

## 2023-10-03 DIAGNOSIS — R509 Fever, unspecified: Secondary | ICD-10-CM | POA: Diagnosis not present

## 2023-10-03 DIAGNOSIS — Z20822 Contact with and (suspected) exposure to covid-19: Secondary | ICD-10-CM | POA: Diagnosis not present

## 2023-10-18 ENCOUNTER — Encounter: Payer: Medicaid Other | Admitting: Physician Assistant

## 2023-12-12 ENCOUNTER — Encounter: Payer: Self-pay | Admitting: Physician Assistant

## 2023-12-12 NOTE — Telephone Encounter (Signed)
 Called and spoke with pt regarding her daughter's labs and sent lab note back for review and FYI; nothing mentioned regarding previous vaccine complications during our conversation. Please advise

## 2023-12-17 NOTE — Telephone Encounter (Signed)
Called pt and was advised pt is doing better, no further action is needed at this time

## 2024-01-02 ENCOUNTER — Other Ambulatory Visit (HOSPITAL_COMMUNITY)
Admission: RE | Admit: 2024-01-02 | Discharge: 2024-01-02 | Disposition: A | Discharge status: Home / Self Care | Payer: Medicaid Other | Source: Ambulatory Visit | Attending: Physician Assistant | Admitting: Physician Assistant

## 2024-01-02 ENCOUNTER — Ambulatory Visit: Payer: Medicaid Other | Attending: Physician Assistant

## 2024-01-02 ENCOUNTER — Ambulatory Visit: Payer: Medicaid Other | Admitting: Physician Assistant

## 2024-01-02 VITALS — BP 130/80 | HR 74 | Temp 97.5°F | Ht 60.93 in | Wt 189.8 lb

## 2024-01-02 DIAGNOSIS — Z124 Encounter for screening for malignant neoplasm of cervix: Secondary | ICD-10-CM | POA: Insufficient documentation

## 2024-01-02 DIAGNOSIS — R5383 Other fatigue: Secondary | ICD-10-CM

## 2024-01-02 DIAGNOSIS — R002 Palpitations: Secondary | ICD-10-CM

## 2024-01-02 DIAGNOSIS — K219 Gastro-esophageal reflux disease without esophagitis: Secondary | ICD-10-CM | POA: Diagnosis not present

## 2024-01-02 DIAGNOSIS — Z0000 Encounter for general adult medical examination without abnormal findings: Secondary | ICD-10-CM

## 2024-01-02 DIAGNOSIS — Z01419 Encounter for gynecological examination (general) (routine) without abnormal findings: Secondary | ICD-10-CM | POA: Diagnosis not present

## 2024-01-02 LAB — COMPREHENSIVE METABOLIC PANEL
ALT: 18 U/L (ref 0–35)
AST: 16 U/L (ref 0–37)
Albumin: 4.4 g/dL (ref 3.5–5.2)
Alkaline Phosphatase: 72 U/L (ref 39–117)
BUN: 9 mg/dL (ref 6–23)
CO2: 27 meq/L (ref 19–32)
Calcium: 9.5 mg/dL (ref 8.4–10.5)
Chloride: 102 meq/L (ref 96–112)
Creatinine, Ser: 0.71 mg/dL (ref 0.40–1.20)
GFR: 109.37 mL/min (ref >60.00)
Glucose, Bld: 82 mg/dL (ref 70–99)
Potassium: 4.2 meq/L (ref 3.5–5.1)
Sodium: 138 meq/L (ref 135–145)
Total Bilirubin: 0.3 mg/dL (ref 0.2–1.2)
Total Protein: 7.2 g/dL (ref 6.0–8.3)

## 2024-01-02 LAB — CBC WITH DIFFERENTIAL/PLATELET
Basophils Absolute: 0.1 10*3/uL (ref 0.0–0.1)
Basophils Relative: 0.8 % (ref 0.0–3.0)
Eosinophils Absolute: 0.3 10*3/uL (ref 0.0–0.7)
Eosinophils Relative: 2.9 % (ref 0.0–5.0)
HCT: 40.9 % (ref 36.0–46.0)
Hemoglobin: 13.5 g/dL (ref 12.0–15.0)
Lymphocytes Relative: 28.7 % (ref 12.0–46.0)
Lymphs Abs: 3.4 10*3/uL (ref 0.7–4.0)
MCHC: 33.1 g/dL (ref 30.0–36.0)
MCV: 84.6 fL (ref 78.0–100.0)
Monocytes Absolute: 0.6 10*3/uL (ref 0.1–1.0)
Monocytes Relative: 5.2 % (ref 3.0–12.0)
Neutro Abs: 7.3 10*3/uL (ref 1.4–7.7)
Neutrophils Relative %: 62.4 % (ref 43.0–77.0)
Platelets: 372 10*3/uL (ref 150.0–400.0)
RBC: 4.83 Mil/uL (ref 3.87–5.11)
RDW: 13.9 % (ref 11.5–15.5)
WBC: 11.7 10*3/uL — ABNORMAL HIGH (ref 4.0–10.5)

## 2024-01-02 LAB — VITAMIN D 25 HYDROXY (VIT D DEFICIENCY, FRACTURES): VITD: 44.43 ng/mL (ref 30.00–100.00)

## 2024-01-02 LAB — HEMOGLOBIN A1C: Hgb A1c MFr Bld: 5.9 % (ref 4.6–6.5)

## 2024-01-02 LAB — LIPID PANEL
Cholesterol: 204 mg/dL — ABNORMAL HIGH (ref 0–200)
HDL: 44.3 mg/dL (ref >39.00)
LDL Cholesterol: 117 mg/dL — ABNORMAL HIGH (ref 0–99)
NonHDL: 159.53
Total CHOL/HDL Ratio: 5
Triglycerides: 211 mg/dL — ABNORMAL HIGH (ref 0.0–149.0)
VLDL: 42.2 mg/dL — ABNORMAL HIGH (ref 0.0–40.0)

## 2024-01-02 LAB — TSH: TSH: 2.87 u[IU]/mL (ref 0.35–5.50)

## 2024-01-02 LAB — VITAMIN B12: Vitamin B-12: 231 pg/mL (ref 211–911)

## 2024-01-02 MED ORDER — PANTOPRAZOLE SODIUM 20 MG PO TBEC: 20.0000 mg | DELAYED_RELEASE_TABLET | Freq: Every day | ORAL | 0 refills | Status: DC

## 2024-01-02 NOTE — Patient Instructions (Signed)
 VISIT SUMMARY:  Today, we addressed several health concerns during your annual physical. We discussed your history of an abnormal Pap smear, your recent experience with palpitations, and your ongoing issues with severe acid reflux. We also completed your annual physical and blood work.  YOUR PLAN:  -ABNORMAL PAP SMEAR: An abnormal Pap smear means that some of the cells on your cervix look different from normal cells. We repeated the Pap smear today, and if the results are abnormal again, we will refer you to a gynecologist for further evaluation.  -PALPITATIONS: Palpitations are feelings of having a fast-beating, fluttering, or pounding heart. We prescribed Ziopatch to monitor your heart activity and advised you to cut back on caffeine, as it can contribute to palpitations.  -GASTROESOPHAGEAL REFLUX DISEASE (GERD): GERD is a condition where stomach acid frequently flows back into the tube connecting your mouth and stomach, causing irritation. We started you on Protonix  20mg  daily for 30 days and referred you to a gastroenterologist for a possible endoscopy. We also advised you to reduce your caffeine intake and avoid spicy and tomato-based foods.  -GENERAL HEALTH MAINTENANCE: We completed your annual physical and blood work today to ensure your overall health is monitored and maintained.  INSTRUCTIONS:  Please follow up with us  once your Pap smear results are available. If you experience any worsening of your symptoms or have any concerns, do not hesitate to contact our office. Additionally, please schedule an appointment with a gastroenterologist for further evaluation of your GERD symptoms.

## 2024-01-02 NOTE — Progress Notes (Unsigned)
 EP to read.

## 2024-01-02 NOTE — Progress Notes (Signed)
 Patient ID: Rosaline DELENA Glatter, female    DOB: May 12, 1986, 38 y.o.   MRN: [id number]   Assessment & Plan:  Annual physical exam -     CBC with Differential/Platelet -     Comprehensive metabolic panel -     Hemoglobin A1c -     Lipid panel -     TSH -     Vitamin B12 -     VITAMIN D  25 Hydroxy (Vit-D Deficiency, Fractures)  Other fatigue  Screening for cervical cancer -     Cytology - PAP  Palpitations -     LONG TERM MONITOR (3-14 DAYS); Future  Gastroesophageal reflux disease, unspecified whether esophagitis present -     Ambulatory referral to Gastroenterology -     Pantoprazole  Sodium; Take 1 tablet (20 mg total) by mouth daily before breakfast.  Dispense: 30 tablet; Refill: 0    Assessment and Plan    Abnormal Pap Smear ASCUS on Pap smear in August 2023. No follow-up with gynecology. No changes in vaginal discharge or sexual history. -Repeat Pap smear today. -If abnormal, refer to gynecology.  Palpitations Reports palpitations for the last 6-12 months. Daily caffeine intake. -Prescribe Xyopatch for further evaluation. -Advise to cut back on caffeine.  Gastroesophageal Reflux Disease (GERD) Severe acid reflux symptoms, including burning sensation with water intake. History of GERD. -Start Protonix  20mg  daily for 30 days. -Refer to gastroenterology for possible endoscopy. -Advise to cut back on caffeine and avoid spicy and tomato-based foods.  General Health Maintenance -Complete annual physical and blood work today.       Patient Counseling: [x]   Nutrition: Stressed importance of moderation in sodium/caffeine intake, saturated fat and cholesterol, caloric balance, sufficient intake of fresh fruits, vegetables, and fiber.  [x]   Stressed the importance of regular exercise.   [x]   Substance Abuse: Discussed cessation/primary prevention of tobacco, alcohol, or other drug use; driving or other dangerous activities under the influence; availability of treatment  for abuse.   []   Injury prevention: Discussed safety belts, safety helmets, smoke detector, smoking near bedding or upholstery.   []   Sexuality: Discussed sexually transmitted diseases, partner selection, use of condoms, avoidance of unintended pregnancy  and contraceptive alternatives.   [x]   Dental health: Discussed importance of regular tooth brushing, flossing, and dental visits.  [x]   Health maintenance and immunizations reviewed. Please refer to Health maintenance section.       Return in about 1 year (around 01/01/2025) for physical.    Subjective:    Chief Complaint  Patient presents with   Annual Exam    Pt in office for annual CPE and fasting labs; pt is not fasting drank a Red Bull this morning;     HPI Discussed the use of AI scribe software for clinical note transcription with the patient, who gave verbal consent to proceed.  History of Present Illness   The patient, a 38 year old with a history of abnormal Pap smear in 2023, presents for an annual physical. She reports no changes in vaginal discharge or sexual history. Her last Pap smear showed slightly abnormal cells, and she has not followed up with a gynecologist since.  In addition, she has been experiencing palpitations for the last six to twelve months. She admits to daily caffeine intake, including coffee and energy drinks, despite attempts to cut back.  Furthermore, she reports severe acid reflux symptoms, even with water intake. She describes a burning sensation in the esophagus and stomach, and has to fall  asleep with Tums in her mouth. Despite these symptoms, she has not made dietary changes to avoid caffeine, spicy food, and tomato-based foods.       Past Medical History:  Diagnosis Date   Allergy    Anxiety    Asthma    Blood transfusion without reported diagnosis    Depression    GERD (gastroesophageal reflux disease)     Past Surgical History:  Procedure Laterality Date   APPENDECTOMY      CHOLECYSTECTOMY     TONSILLECTOMY     TUBAL LIGATION      Family History  Problem Relation Age of Onset   Diabetes Mother    High Cholesterol Mother    Hypertension Mother    Diabetes Father    Hypertension Father    High Cholesterol Father    Heart Problems Father    Hypertension Brother    High Cholesterol Brother     Social History   Tobacco Use   Smoking status: Every Day    Types: Cigarettes   Smokeless tobacco: Never  Vaping Use   Vaping status: Every Day  Substance Use Topics   Alcohol use: Not Currently   Drug use: Not Currently     Allergies  Allergen Reactions   Lithium Other (See Comments)    Worsened bipolar Worsened bipolar    Quetiapine Other (See Comments)    Worsened bipolar Worsened bipolar    Clindamycin Rash   Penicillins Rash    Review of Systems NEGATIVE UNLESS OTHERWISE INDICATED IN HPI      Objective:     BP 130/80 (BP Location: Left Arm, Patient Position: Sitting, Cuff Size: Normal)   Pulse 74   Temp (!) 97.5 F (36.4 C) (Temporal)   Ht 5' 0.93 (1.548 m)   Wt 189 lb 12.8 oz (86.1 kg)   LMP 12/19/2023 (Approximate)   SpO2 98%   BMI 35.95 kg/m   Wt Readings from Last 3 Encounters:  01/02/24 189 lb 12.8 oz (86.1 kg)  07/11/22 182 lb (82.6 kg)  07/15/21 177 lb 11.1 oz (80.6 kg)    BP Readings from Last 3 Encounters:  01/02/24 130/80  07/11/22 118/81  07/05/21 110/74     Physical Exam Vitals and nursing note reviewed. Exam conducted with a chaperone present.  Constitutional:      Appearance: Normal appearance. She is normal weight. She is not toxic-appearing.  HENT:     Head: Normocephalic and atraumatic.     Right Ear: External ear normal.     Left Ear: External ear normal.     Mouth/Throat:     Mouth: Mucous membranes are moist.  Eyes:     Extraocular Movements: Extraocular movements intact.     Conjunctiva/sclera: Conjunctivae normal.     Pupils: Pupils are equal, round, and reactive to light.  Neck:      Thyroid: No thyroid mass, thyromegaly or thyroid tenderness.  Cardiovascular:     Rate and Rhythm: Normal rate and regular rhythm.     Pulses: Normal pulses.     Heart sounds: Normal heart sounds.  Pulmonary:     Effort: Pulmonary effort is normal.     Breath sounds: Normal breath sounds.  Chest:     Chest wall: No mass.  Breasts:    Right: Normal. No swelling, bleeding, inverted nipple, mass, nipple discharge, skin change or tenderness.     Left: Normal. No swelling, bleeding, inverted nipple, mass, nipple discharge, skin change or tenderness.  Abdominal:  General: Abdomen is flat. Bowel sounds are normal.     Palpations: Abdomen is soft.  Genitourinary:    General: Normal vulva.     Labia:        Right: No rash, tenderness or lesion.        Left: No rash, tenderness or lesion.      Vagina: Normal.     Cervix: Normal.     Uterus: Normal.      Adnexa: Right adnexa normal and left adnexa normal.  Musculoskeletal:        General: Normal range of motion.     Cervical back: Normal range of motion and neck supple.     Right lower leg: No edema.     Left lower leg: No edema.  Lymphadenopathy:     Cervical: No cervical adenopathy.     Upper Body:     Right upper body: No supraclavicular, axillary or pectoral adenopathy.     Left upper body: No supraclavicular, axillary or pectoral adenopathy.  Skin:    General: Skin is warm and dry.     Findings: No lesion.  Neurological:     General: No focal deficit present.     Mental Status: She is alert and oriented to person, place, and time.  Psychiatric:        Mood and Affect: Mood normal.        Behavior: Behavior normal.        Thought Content: Thought content normal.        Judgment: Judgment normal.       Tayt Moyers M Ayub Kirsh, PA-C

## 2024-01-03 ENCOUNTER — Encounter: Payer: Self-pay | Admitting: Physician Assistant

## 2024-01-09 LAB — CYTOLOGY - PAP
Chlamydia: NEGATIVE
Comment: NEGATIVE
Comment: NEGATIVE
Comment: NEGATIVE
Comment: NORMAL
Diagnosis: NEGATIVE
Diagnosis: REACTIVE
High risk HPV: NEGATIVE
Neisseria Gonorrhea: NEGATIVE
Trichomonas: NEGATIVE

## 2024-01-17 DIAGNOSIS — R002 Palpitations: Secondary | ICD-10-CM

## 2024-02-04 ENCOUNTER — Encounter (HOSPITAL_BASED_OUTPATIENT_CLINIC_OR_DEPARTMENT_OTHER): Payer: Self-pay | Admitting: Emergency Medicine

## 2024-02-04 ENCOUNTER — Emergency Department (HOSPITAL_BASED_OUTPATIENT_CLINIC_OR_DEPARTMENT_OTHER)
Admission: EM | Admit: 2024-02-04 | Discharge: 2024-02-04 | Disposition: A | Discharge status: Home / Self Care | Attending: Emergency Medicine | Admitting: Emergency Medicine

## 2024-02-04 ENCOUNTER — Other Ambulatory Visit: Payer: Self-pay

## 2024-02-04 ENCOUNTER — Emergency Department (HOSPITAL_BASED_OUTPATIENT_CLINIC_OR_DEPARTMENT_OTHER): Admitting: Radiology

## 2024-02-04 DIAGNOSIS — R002 Palpitations: Secondary | ICD-10-CM

## 2024-02-04 DIAGNOSIS — D72829 Elevated white blood cell count, unspecified: Secondary | ICD-10-CM | POA: Diagnosis not present

## 2024-02-04 DIAGNOSIS — J45909 Unspecified asthma, uncomplicated: Secondary | ICD-10-CM | POA: Diagnosis not present

## 2024-02-04 DIAGNOSIS — R079 Chest pain, unspecified: Secondary | ICD-10-CM | POA: Diagnosis not present

## 2024-02-04 LAB — COMPREHENSIVE METABOLIC PANEL
ALT: 29 U/L (ref 0–44)
AST: 19 U/L (ref 15–41)
Albumin: 5 g/dL (ref 3.5–5.0)
Alkaline Phosphatase: 78 U/L (ref 38–126)
Anion gap: 11 (ref 5–15)
BUN: 7 mg/dL (ref 6–20)
CO2: 26 mmol/L (ref 22–32)
Calcium: 9.6 mg/dL (ref 8.9–10.3)
Chloride: 100 mmol/L (ref 98–111)
Creatinine, Ser: 0.71 mg/dL (ref 0.44–1.00)
GFR, Estimated: >60 mL/min (ref >60)
Glucose, Bld: 112 mg/dL — ABNORMAL HIGH (ref 70–99)
Potassium: 3.6 mmol/L (ref 3.5–5.1)
Sodium: 137 mmol/L (ref 135–145)
Total Bilirubin: 0.4 mg/dL (ref 0.0–1.2)
Total Protein: 8 g/dL (ref 6.5–8.1)

## 2024-02-04 LAB — CBC
HCT: 41.6 % (ref 36.0–46.0)
Hemoglobin: 13.9 g/dL (ref 12.0–15.0)
MCH: 27.6 pg (ref 26.0–34.0)
MCHC: 33.4 g/dL (ref 30.0–36.0)
MCV: 82.5 fL (ref 80.0–100.0)
Platelets: 379 10*3/uL (ref 150–400)
RBC: 5.04 MIL/uL (ref 3.87–5.11)
RDW: 13.2 % (ref 11.5–15.5)
WBC: 13.5 10*3/uL — ABNORMAL HIGH (ref 4.0–10.5)
nRBC: 0 % (ref 0.0–0.2)

## 2024-02-04 LAB — TROPONIN I (HIGH SENSITIVITY)
Troponin I (High Sensitivity): <2 ng/L (ref <18)
Troponin I (High Sensitivity): <2 ng/L (ref <18)

## 2024-02-04 NOTE — ED Provider Notes (Signed)
 [place] EMERGENCY DEPARTMENT AT [facility] Provider Note   CSN: [id number] Arrival date & time: 02/04/24  1827     History  Chief Complaint  Patient presents with   Palpitations    Nicole Pitts is a 38 y.o. female.   Palpitations   38 year old female presents emergency department complaints of palpitations.  Has been having intermittent palpitations the past couple of months.  Episodes seem to occur at random times lasting variable amounts of time.  States that she had worsening palpitations last night.  Was wearing a Zio patch for the past 2 weeks by primary care and has upcoming appointment with cardiology.  Chest gave the Zio patch back yesterday and has not heard results.  States that she does have chest discomfort when she is having significant palpitations.  Denies any history of DVT/PE, recent surgery/immobilization, known malignancy, known coagulopathy, hormonal therapy.  States she typically only drinks 1 caffeinated beverage a day as she had a can of soda.  States she has had thyroid testing by primary care that was normal.  Past medical history significant for GERD, asthma, allergy, anxiety  Home Medications Prior to Admission medications   Medication Sig Start Date End Date Taking? Authorizing Provider  pantoprazole (PROTONIX) 20 MG tablet Take 1 tablet (20 mg total) by mouth daily before breakfast. 01/02/24   Allwardt, Alyssa M, PA-C      Allergies    Lithium, Quetiapine, Clindamycin, and Penicillins    Review of Systems   Review of Systems  Cardiovascular:  Positive for palpitations.  All other systems reviewed and are negative.   Physical Exam Updated Vital Signs BP (!) 98/54   Pulse 66   Temp 98.2 F (36.8 C)   Resp (!) 21   Wt 86.2 kg   LMP 01/11/2024 (Approximate)   SpO2 100%   BMI 35.99 kg/m  Physical Exam Vitals and nursing note reviewed.  Constitutional:      General: She is not in acute distress.    Appearance: She is  well-developed.  HENT:     Head: Normocephalic and atraumatic.  Eyes:     Conjunctiva/sclera: Conjunctivae normal.  Cardiovascular:     Rate and Rhythm: Normal rate and regular rhythm.     Pulses: Normal pulses.     Heart sounds: No murmur heard.    Comments: Patient without obvious arrhythmia on EKG or on cardiac monitoring while the ED.  Did throw a couple PVCs but were spaced out Pulmonary:     Effort: Pulmonary effort is normal. No respiratory distress.     Breath sounds: Normal breath sounds. No wheezing, rhonchi or rales.  Abdominal:     Palpations: Abdomen is soft.     Tenderness: There is no abdominal tenderness.  Musculoskeletal:        General: No swelling.     Cervical back: Neck supple.     Right lower leg: No edema.     Left lower leg: No edema.  Skin:    General: Skin is warm and dry.     Capillary Refill: Capillary refill takes less than 2 seconds.  Neurological:     Mental Status: She is alert.  Psychiatric:        Mood and Affect: Mood normal.     ED Results / Procedures / Treatments   Labs (all labs ordered are listed, but only abnormal results are displayed) Labs Reviewed  CBC - Abnormal; Notable for the following components:  Result Value   WBC 13.5 (*)    All other components within normal limits  COMPREHENSIVE METABOLIC PANEL - Abnormal; Notable for the following components:   Glucose, Bld 112 (*)    All other components within normal limits  PREGNANCY, URINE  TROPONIN I (HIGH SENSITIVITY)  TROPONIN I (HIGH SENSITIVITY)    EKG None  Radiology DG Chest 2 View Result Date: 02/04/2024 CLINICAL DATA:  Chest pain. EXAM: CHEST - 2 VIEW COMPARISON:  None Available. FINDINGS: The heart size and mediastinal contours are within normal limits. Both lungs are clear. The visualized skeletal structures are unremarkable. IMPRESSION: No active cardiopulmonary disease. Electronically Signed   By: Elgie Collard M.D.   On: 02/04/2024 21:56     Procedures Procedures    Medications Ordered in ED Medications - No data to display  ED Course/ Medical Decision Making/ A&P                                 Medical Decision Making Amount and/or Complexity of Data Reviewed Labs: ordered. Radiology: ordered.   This patient presents to the ED for concern of palpitations, this involves an extensive number of treatment options, and is a complaint that carries with it a high risk of complications and morbidity.  The differential diagnosis includes he has, arrhythmia, electrolyte derangement, PE, hyperthyroidism, other   Co morbidities that complicate the patient evaluation  See HPI   Additional history obtained:  Additional history obtained from EMR External records from outside source obtained and reviewed including hospital records   Lab Tests:  I Ordered, and personally interpreted labs.  The pertinent results include: Leukocytosis of 13.5.  No evidence of anemia.  Platelets within range.  No Electra abnormalities.  No transaminitis.  No renal dysfunction.  Initial troponin of less than 2 with repeat less than 2.   Imaging Studies ordered:  I ordered imaging studies including chest x-ray I independently visualized and interpreted imaging which showed acute cardiopulmonary abnormality. I agree with the radiologist interpretation   Cardiac Monitoring: / EKG:  The patient was maintained on a cardiac monitor.  I personally viewed and interpreted the cardiac monitored which showed an underlying rhythm of: Sinus rhythm   Consultations Obtained:  N/a   Problem List / ED Course / Critical interventions / Medication management  Palpitations Reevaluation of the patient showed that the patient stayed the same I have reviewed the patients home medicines and have made adjustments as needed   Social Determinants of Health:  Denies tobacco, illicit drug use.   Test / Admission -  Considered:  Palpitations Vitals signs  within normal range and stable throughout visit. Laboratory/imaging studies significant for: See above 38 year old female presents emergency department complaints of palpitations.  Has been having intermittent palpitations the past couple of months.  Episodes seem to occur at random times lasting variable amounts of time.  States that she had worsening palpitations last night.  Was wearing a Zio patch for the past 2 weeks by primary care and has upcoming appointment with cardiology.  Chest gave the Zio patch back yesterday and has not heard results.  States that she does have chest discomfort when she is having significant palpitations.  Denies any history of DVT/PE, recent surgery/immobilization, known malignancy, known coagulopathy, hormonal therapy.  States she typically only drinks 1 caffeinated beverage a day as she had a can of soda.  States she has had thyroid testing by  primary care that was normal. On exam, no obvious arrhythmia.  Patient with 2 PVCs appreciated on cardiac monitoring but were very spaced out.  EKG without obvious arrhythmia; cardiac monitoring on patient's stay showed no evidence of arrhythmia.  Labs reassuring.  Delta negative troponin, lack of acute ischemic change on EKG; low suspicion for ACS.  Patient with normal thyroid testing last month; low suspicion for hyperthyroidism.  Patient Wells PE 0 and PERC negative; low suspicion for PE.  Suspect the patient is having arrhythmia but not appreciable on monitoring while in the ED.  Did recently had Zio patch that was sent back in yesterday.  Will recommend follow-up with PCP/cardiology in the outpatient setting with strict return precautions discussed at length.  Will recommend limit caffeine consumption as well.  Treatment plan discussed at length with patient and she acknowledged understanding was agreeable to said plan.  Patient overall well-appearing, afebrile in no acute  distress. Worrisome signs and symptoms were discussed with the patient, and the patient acknowledged understanding to return to the ED if noticed. Patient was stable upon discharge.          Final Clinical Impression(s) / ED Diagnoses Final diagnoses:  Palpitations    Rx / DC Orders ED Discharge Orders     None         Peter Garter, [place] 02/04/24 2313    Derwood Kaplan, MD 02/05/24 2333

## 2024-02-04 NOTE — ED Triage Notes (Signed)
 Patient c/o palpitations x 2 months, worse since last night.  Patient is being followed by cardiology and just got done wearing a heart monitor.  Patient denies chest pain.

## 2024-02-04 NOTE — Discharge Instructions (Signed)
 As discussed, your workup today was overall reassuring.  Your heart enzymes were normal.  EKG and chest x-ray appeared normal.  Again, was stressed limiting caffeine consumption as this can make your symptoms worse.  If palpitations began again, please not hesitate to return as there is concern for abnormal heart rhythm.

## 2024-02-12 ENCOUNTER — Encounter: Payer: Self-pay | Admitting: Physician Assistant

## 2024-02-12 NOTE — Telephone Encounter (Signed)
 Please see pt msg and advise

## 2024-08-27 ENCOUNTER — Encounter: Payer: Self-pay | Admitting: Physician Assistant

## 2024-08-27 NOTE — Telephone Encounter (Signed)
 Please see pt msg regarding Christian

## 2024-08-27 NOTE — Telephone Encounter (Signed)
 Please see Alyssa msg and schedule patient for tomorrow at 3pm

## 2024-12-17 ENCOUNTER — Encounter: Payer: Self-pay | Admitting: Physician Assistant

## 2024-12-17 NOTE — Telephone Encounter (Signed)
"  Patient has been scheduled for VV.  "

## 2024-12-17 NOTE — Telephone Encounter (Signed)
 Please see pt request asking for refill for her son; not showing med requested has been taken in several months, please advise

## 2024-12-18 ENCOUNTER — Ambulatory Visit: Admitting: Physician Assistant

## 2024-12-18 ENCOUNTER — Encounter: Payer: Self-pay | Admitting: Physician Assistant

## 2025-01-02 ENCOUNTER — Encounter: Payer: Self-pay | Admitting: Physician Assistant

## 2025-01-02 ENCOUNTER — Ambulatory Visit: Payer: Medicaid Other | Admitting: Physician Assistant

## 2025-01-02 VITALS — BP 134/88 | HR 82 | Temp 98.3°F | Ht 61.0 in | Wt 197.4 lb

## 2025-01-02 DIAGNOSIS — Z0000 Encounter for general adult medical examination without abnormal findings: Secondary | ICD-10-CM

## 2025-01-02 DIAGNOSIS — K219 Gastro-esophageal reflux disease without esophagitis: Secondary | ICD-10-CM

## 2025-01-02 DIAGNOSIS — I471 Supraventricular tachycardia, unspecified: Secondary | ICD-10-CM

## 2025-01-02 DIAGNOSIS — E6609 Other obesity due to excess calories: Secondary | ICD-10-CM

## 2025-01-02 DIAGNOSIS — F17200 Nicotine dependence, unspecified, uncomplicated: Secondary | ICD-10-CM

## 2025-01-02 LAB — CBC WITH DIFFERENTIAL/PLATELET
Basophils Absolute: 0.1 10*3/uL (ref 0.0–0.1)
Basophils Relative: 0.7 % (ref 0.0–3.0)
Eosinophils Absolute: 0.3 10*3/uL (ref 0.0–0.7)
Eosinophils Relative: 2.6 % (ref 0.0–5.0)
HCT: 40 % (ref 36.0–46.0)
Hemoglobin: 13.2 g/dL (ref 12.0–15.0)
Lymphocytes Relative: 27 % (ref 12.0–46.0)
Lymphs Abs: 2.7 10*3/uL (ref 0.7–4.0)
MCHC: 33 g/dL (ref 30.0–36.0)
MCV: 82.7 fl (ref 78.0–100.0)
Monocytes Absolute: 0.5 10*3/uL (ref 0.1–1.0)
Monocytes Relative: 4.6 % (ref 3.0–12.0)
Neutro Abs: 6.6 10*3/uL (ref 1.4–7.7)
Neutrophils Relative %: 65.1 % (ref 43.0–77.0)
Platelets: 355 10*3/uL (ref 150.0–400.0)
RBC: 4.84 Mil/uL (ref 3.87–5.11)
RDW: 13.9 % (ref 11.5–15.5)
WBC: 10.1 10*3/uL (ref 4.0–10.5)

## 2025-01-02 LAB — COMPREHENSIVE METABOLIC PANEL WITH GFR
ALT: 56 U/L — ABNORMAL HIGH (ref 3–35)
AST: 35 U/L (ref 5–37)
Albumin: 4.6 g/dL (ref 3.5–5.2)
Alkaline Phosphatase: 73 U/L (ref 39–117)
BUN: 10 mg/dL (ref 6–23)
CO2: 28 meq/L (ref 19–32)
Calcium: 10 mg/dL (ref 8.4–10.5)
Chloride: 101 meq/L (ref 96–112)
Creatinine, Ser: 0.74 mg/dL (ref 0.40–1.20)
GFR: 103.34 mL/min
Glucose, Bld: 100 mg/dL — ABNORMAL HIGH (ref 70–99)
Potassium: 4.3 meq/L (ref 3.5–5.1)
Sodium: 139 meq/L (ref 135–145)
Total Bilirubin: 0.4 mg/dL (ref 0.2–1.2)
Total Protein: 7.9 g/dL (ref 6.0–8.3)

## 2025-01-02 LAB — LIPID PANEL
Cholesterol: 224 mg/dL — ABNORMAL HIGH (ref 28–200)
HDL: 45.2 mg/dL
LDL Cholesterol: 138 mg/dL — ABNORMAL HIGH (ref 10–99)
NonHDL: 178.94
Total CHOL/HDL Ratio: 5
Triglycerides: 207 mg/dL — ABNORMAL HIGH (ref 10.0–149.0)
VLDL: 41.4 mg/dL — ABNORMAL HIGH (ref 0.0–40.0)

## 2025-01-02 LAB — HEMOGLOBIN A1C: Hgb A1c MFr Bld: 6 % (ref 4.6–6.5)

## 2025-01-02 LAB — TSH: TSH: 2.76 u[IU]/mL (ref 0.35–5.50)

## 2025-01-02 MED ORDER — METOPROLOL SUCCINATE ER 25 MG PO TB24: 25.0000 mg | ORAL_TABLET | Freq: Every evening | ORAL | 2 refills | Status: AC

## 2025-01-02 NOTE — Progress Notes (Signed)
 "   Patient ID: Nicole Pitts, female    DOB: [birth year], 39 y.o.   MRN: [id number]   Assessment & Plan:  Annual physical exam -     CBC with Differential/Platelet -     Comprehensive metabolic panel with GFR -     Hemoglobin A1c -     Lipid panel -     TSH  Gastroesophageal reflux disease, unspecified whether esophagitis present -     Ambulatory referral to Gastroenterology  Class 2 obesity due to excess calories without serious comorbidity with body mass index (BMI) of 37.0 to 37.9 in adult  Nicotine dependence, uncomplicated, unspecified nicotine product type  Paroxysmal SVT (supraventricular tachycardia) -     Metoprolol  Succinate ER; Take 1 tablet (25 mg total) by mouth every evening.  Dispense: 30 tablet; Refill: 2      Assessment and Plan Assessment & Plan Class 2 obesity BMI of 37.3. She is motivated to lose weight and has been exercising regularly. Considering GLP-1 therapy pending lab results to assess for diabetes. - Checked labs to assess for diabetes - Will consider starting GLP-1 therapy (Wegovy or Ozempic) based on lab results - Encouraged increased physical activity - Advised on dietary modifications, including avoiding late meals and reducing intake of noodles  Gastroesophageal reflux disease GERD with increased frequency of symptoms, occurring three times a week. Previous esophagitis noted. Current management with over-the-counter PPIs. Concerns about potential scarring and need for re-evaluation. - Referred to [facility] for endoscopy - Continue over-the-counter PPIs - Advised on lifestyle modifications: avoid late meals, large meals, spicy and greasy foods, and reduce caffeine and nicotine intake  Paroxysmal supraventricular tachycardia Paroxysmal SVT with previous episodes noted on Holter monitor. Symptoms include rapid heartbeat and difficulty breathing. No atrial fibrillation noted. Previous use of metoprolol  as a teenager. - Started  Toprol  XL 25 mg every evening - Advised on reducing caffeine and nicotine intake  Nicotine dependence Nicotine use contributing to GERD symptoms and SVT. She acknowledges difficulty in reducing intake. - Advised on reducing nicotine intake  General Health Maintenance Up to date on Pap smears and tetanus vaccination. No mammogram needed until age 71. Discussed potential pneumonia vaccine. - Consider pneumonia vaccine - Continue routine health maintenance screenings   Age-appropriate screening and counseling performed today. Will check labs and call with results. Preventive measures discussed and printed in AVS for patient.   Patient Counseling: [x]   Nutrition: Stressed importance of moderation in sodium/caffeine intake, saturated fat and cholesterol, caloric balance, sufficient intake of fresh fruits, vegetables, and fiber.  [x]   Stressed the importance of regular exercise.   [x]   Substance Abuse: Discussed cessation/primary prevention of tobacco, alcohol, or other drug use; driving or other dangerous activities under the influence; availability of treatment for abuse.   []   Injury prevention: Discussed safety belts, safety helmets, smoke detector, smoking near bedding or upholstery.   []   Sexuality: Discussed sexually transmitted diseases, partner selection, use of condoms, avoidance of unintended pregnancy  and contraceptive alternatives.   [x]   Dental health: Discussed importance of regular tooth brushing, flossing, and dental visits.  [x]   Health maintenance and immunizations reviewed. Please refer to Health maintenance section.       Return in about 6 months (around 07/02/2025) for recheck/follow-up.    Subjective:    Chief Complaint  Patient presents with   Annual Exam    Pt in office for annual CPE and fasting labs; pt admits had Red Bull this morning sugar free;  HPI Discussed the use of AI scribe software for clinical note transcription with the patient, who gave  verbal consent to proceed.  History of Present Illness Nicole Pitts is a 39 year old female presenting for her annual physical exam.  She has a history of high cholesterol, which was noted to be elevated last year. Despite efforts to manage her weight through diet and exercise, including using a treadmill and an endo step machine, her weight has fluctuated between 170 and 190 pounds. She is concerned about her cholesterol levels.  She experiences gastrointestinal issues, including acid reflux occurring three times a week, which she describes as 'very scary' due to the sensation of acid coming up and difficulty catching her breath. She has a history of esophagitis diagnosed in her teenage years and has been using over-the-counter acid blockers after finding prescribed medication insufficient. She does not have a gallbladder and is exploring natural remedies, including enzymes, to aid digestion. Eating often leads to immediate bowel movements or constipation.  She experiences paroxysmal supraventricular tachycardia (SVT) with daily episodes of rapid heartbeat that 'scares' her as it feels like her airway is cut off. She has a history of SVT and was previously on metoprolol  as a teenager. She has stopped caffeine but recently resumed its use.  She reports back pain that worsens with bending and radiates to her hips, which she suspects may be due to a pinched nerve. This has been a long-standing issue that she feels is worsening.  She has not had a mammogram and reports no significant breast changes, though she notes some sensitivity in certain areas due to dense tissue. She has not had a hysterectomy and continues to have regular menstrual cycles. Monogamous.   She mentions mental health fluctuations but feels she is managing well. 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     Past Medical History:   Diagnosis Date   Allergy    Anxiety    Asthma    Blood transfusion without reported diagnosis    Depression    GERD (gastroesophageal reflux disease)     Past Surgical History:  Procedure Laterality Date   APPENDECTOMY     CHOLECYSTECTOMY     TONSILLECTOMY     TUBAL LIGATION      Family History  Problem Relation Age of Onset   Diabetes Mother    High Cholesterol Mother    Hypertension Mother    Diabetes Father    Hypertension Father    High Cholesterol Father    Heart Problems Father    Hypertension Brother    High Cholesterol Brother     Social History[1]   Allergies[2]  Review of Systems NEGATIVE UNLESS OTHERWISE INDICATED IN HPI      Objective:     BP 134/88 (BP Location: Left Arm, Patient Position: Sitting, Cuff Size: Large)   Pulse 82   Temp 98.3 F (36.8 C) (Temporal)   Ht 5' 1 (1.549 m)   Wt 197 lb 6.4 oz (89.5 kg)   LMP 12/28/2024 (Exact Date)   SpO2 98%   BMI 37.30 kg/m   Wt Readings from Last 3 Encounters:  01/02/25 197 lb 6.4 oz (89.5 kg)  02/04/24 190 lb (86.2 kg)  01/02/24 189 lb 12.8 oz (86.1 kg)    BP Readings from Last 3 Encounters:  01/02/25 134/88  02/04/24 (!) 98/54  01/02/24 130/80     Physical  Exam Vitals and nursing note reviewed.  Constitutional:      Appearance: Normal appearance. She is obese. She is not toxic-appearing.  HENT:     Head: Normocephalic and atraumatic.     Right Ear: Tympanic membrane, ear canal and external ear normal.     Left Ear: Tympanic membrane, ear canal and external ear normal.     Nose: Nose normal.     Mouth/Throat:     Mouth: Mucous membranes are moist.  Eyes:     Extraocular Movements: Extraocular movements intact.     Conjunctiva/sclera: Conjunctivae normal.     Pupils: Pupils are equal, round, and reactive to light.  Cardiovascular:     Rate and Rhythm: Normal rate and regular rhythm.     Pulses: Normal pulses.     Heart sounds: Normal heart sounds.  Pulmonary:     Effort:  Pulmonary effort is normal.     Breath sounds: Normal breath sounds.  Abdominal:     General: Abdomen is flat. Bowel sounds are normal.     Palpations: Abdomen is soft.  Musculoskeletal:        General: Normal range of motion.     Cervical back: Normal range of motion and neck supple.  Skin:    General: Skin is warm and dry.  Neurological:     General: No focal deficit present.     Mental Status: She is alert and oriented to person, place, and time.  Psychiatric:        Mood and Affect: Mood normal.        Behavior: Behavior normal.        Thought Content: Thought content normal.        Judgment: Judgment normal.             Arushi Partridge M Artie Takayama, PA-C     [1]  Social History Tobacco Use   Smoking status: Every Day    Types: Cigarettes   Smokeless tobacco: Never  Vaping Use   Vaping status: Every Day  Substance Use Topics   Alcohol use: Not Currently   Drug use: Not Currently  [2]  Allergies Allergen Reactions   Lithium Other (See Comments)    Worsened bipolar Worsened bipolar    Quetiapine Other (See Comments)    Worsened bipolar Worsened bipolar    Clindamycin Rash   Penicillins Rash   "

## 2025-01-02 NOTE — Patient Instructions (Addendum)
 For the kids: [facility]: Phone: [phone] [place]- [phone]    VISIT SUMMARY: Today, we discussed your annual physical exam and addressed several health concerns including high cholesterol, acid reflux, rapid heartbeat episodes, and back pain. We also reviewed your weight management efforts and discussed potential new treatments.  YOUR PLAN: CLASS 2 OBESITY: Your BMI is 37.3, and you are motivated to lose weight. We are considering GLP-1 therapy pending lab results to assess for diabetes. -We checked your labs to assess for diabetes. -We will consider starting GLP-1 therapy (Wegovy or Ozempic) based on your lab results. -Increase your physical activity. -Avoid late meals and reduce your intake of noodles.  GASTROESOPHAGEAL REFLUX DISEASE (GERD): You have GERD with symptoms occurring three times a week. You are currently managing with over-the-counter PPIs. -You are referred to [facility] for an endoscopy. -Continue using over-the-counter PPIs. -Avoid late meals, large meals, spicy and greasy foods, and reduce caffeine and nicotine intake.  PAROXYSMAL SUPRAVENTRICULAR TACHYCARDIA (SVT): You have episodes of rapid heartbeat and difficulty breathing. You have a history of SVT and were previously on metoprolol . -Start taking Toprol  XL 25 mg every evening. -Reduce your caffeine and nicotine intake.  NICOTINE DEPENDENCE: Your nicotine use is contributing to your GERD symptoms and SVT. -Reduce your nicotine intake.  GENERAL HEALTH MAINTENANCE: You are up to date on Pap smears and tetanus vaccination. No mammogram needed until age 23. -Consider getting a pneumonia vaccine. -Continue routine health maintenance screenings.    Contains text generated by [employer].

## 2025-07-07 ENCOUNTER — Ambulatory Visit: Admitting: Physician Assistant
